# Patient Record
Sex: Male | Born: 1965 | Race: White | Hispanic: No | Marital: Single | Smoking: Never smoker
Health system: Southern US, Community
[De-identification: ages and names within clinical notes are randomized; demographics above are authoritative.]

## PROBLEM LIST (undated history)

## (undated) HISTORY — PX: APPENDECTOMY: SHX54

## (undated) HISTORY — PX: BACK SURGERY: SHX140

---

## 2016-08-24 ENCOUNTER — Emergency Department (HOSPITAL_BASED_OUTPATIENT_CLINIC_OR_DEPARTMENT_OTHER)
Admission: EM | Admit: 2016-08-24 | Discharge: 2016-08-24 | Disposition: A | Discharge status: Home / Self Care | Payer: Worker's Compensation | Attending: Emergency Medicine | Admitting: Emergency Medicine

## 2016-08-24 ENCOUNTER — Encounter (HOSPITAL_BASED_OUTPATIENT_CLINIC_OR_DEPARTMENT_OTHER): Payer: Self-pay | Admitting: Emergency Medicine

## 2016-08-24 ENCOUNTER — Emergency Department (HOSPITAL_BASED_OUTPATIENT_CLINIC_OR_DEPARTMENT_OTHER): Payer: Worker's Compensation

## 2016-08-24 DIAGNOSIS — Y999 Unspecified external cause status: Secondary | ICD-10-CM | POA: Diagnosis not present

## 2016-08-24 DIAGNOSIS — X509XXA Other and unspecified overexertion or strenuous movements or postures, initial encounter: Secondary | ICD-10-CM | POA: Insufficient documentation

## 2016-08-24 DIAGNOSIS — S86111A Strain of other muscle(s) and tendon(s) of posterior muscle group at lower leg level, right leg, initial encounter: Secondary | ICD-10-CM

## 2016-08-24 DIAGNOSIS — Y929 Unspecified place or not applicable: Secondary | ICD-10-CM | POA: Insufficient documentation

## 2016-08-24 DIAGNOSIS — Y9389 Activity, other specified: Secondary | ICD-10-CM | POA: Insufficient documentation

## 2016-08-24 DIAGNOSIS — S8991XA Unspecified injury of right lower leg, initial encounter: Secondary | ICD-10-CM | POA: Diagnosis present

## 2016-08-24 MED ORDER — KETOROLAC TROMETHAMINE 60 MG/2ML IM SOLN
60.0000 mg | Freq: Once | INTRAMUSCULAR | Status: DC
Start: 2016-08-24 — End: 2016-08-24
  Filled 2016-08-24: qty 2

## 2016-08-24 NOTE — ED Triage Notes (Signed)
R leg pain after pulling down a trailer door at work.
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
